# Patient Record
Sex: Female | Born: 1975 | Race: Black or African American | Hispanic: No | Marital: Single | Smoking: Never smoker
Health system: Southern US, Community
[De-identification: ages and names within clinical notes are randomized; demographics above are authoritative.]

---

## 2011-12-21 HISTORY — PX: OVARIAN CYST REMOVAL: SHX89

## 2015-04-21 ENCOUNTER — Telehealth: Payer: Self-pay | Admitting: Family Medicine

## 2015-04-21 NOTE — Telephone Encounter (Signed)
Ok per [employer].

## 2015-04-21 NOTE — Telephone Encounter (Signed)
Angela Zuniga, Angela Zuniga MRN# [id number] referred daughter Angela Zuniga to establish care with Dr. Birdie Riddle please advise.

## 2015-04-21 NOTE — Telephone Encounter (Signed)
Pt scheduled new patient appointment for 09/01/2015

## 2015-04-24 ENCOUNTER — Ambulatory Visit (INDEPENDENT_AMBULATORY_CARE_PROVIDER_SITE_OTHER): Payer: BLUE CROSS/BLUE SHIELD | Admitting: Medical

## 2015-04-24 ENCOUNTER — Encounter: Payer: Self-pay | Admitting: Medical

## 2015-04-24 VITALS — BP 135/85 | HR 86 | Temp 98.8°F | Ht 62.5 in | Wt 217.8 lb

## 2015-04-24 DIAGNOSIS — G43809 Other migraine, not intractable, without status migrainosus: Secondary | ICD-10-CM

## 2015-04-24 DIAGNOSIS — R030 Elevated blood-pressure reading, without diagnosis of hypertension: Secondary | ICD-10-CM | POA: Diagnosis not present

## 2015-04-24 DIAGNOSIS — IMO0001 Reserved for inherently not codable concepts without codable children: Secondary | ICD-10-CM

## 2015-04-24 DIAGNOSIS — D179 Benign lipomatous neoplasm, unspecified: Secondary | ICD-10-CM

## 2015-04-24 MED ORDER — SUMATRIPTAN SUCCINATE 50 MG PO TABS: 50.0000 mg | ORAL_TABLET | ORAL | Status: AC | PRN

## 2015-04-24 NOTE — Progress Notes (Signed)
Pre visit review using our clinic review tool, if applicable. No additional management support is needed unless otherwise documented below in the visit note. 

## 2015-04-24 NOTE — Assessment & Plan Note (Signed)
Will rx imitrex. Since ha intensity worsened past month. Long hx of HA and no prior imaging studies will try to get outpt nonemergent ct of head. If not cleared by insurance then refer to neurologist. If at any point severe ha or neuro signs or symptoms as advised then ED eval.

## 2015-04-24 NOTE — Patient Instructions (Signed)
Migraine variant Will rx imitrex. Since ha intensity worsened past month. Long hx of HA and no prior imaging studies will try to get outpt nonemergent ct of head. If not cleared by insurance then refer to neurologist. If at any point severe ha or neuro signs or symptoms as advised then ED eval.   Elevated BP Mild bp elevation today by lpn then when I checked less. Low salt diet. Check bp when can and document reading. Elevated bp could be contributing factor for ha.   Lipoma Left side of scalp. Refer to general surgeon.    Follow up in 2 wks or as needed.

## 2015-04-24 NOTE — Assessment & Plan Note (Signed)
Mild bp elevation today by lpn then when I checked less. Low salt diet. Check bp when can and document reading. Elevated bp could be contributing factor for ha.

## 2015-04-24 NOTE — Assessment & Plan Note (Signed)
Left side of scalp. Refer to general surgeon.

## 2015-04-24 NOTE — Progress Notes (Signed)
   Subjective:    Patient ID: Angela Zuniga, female    DOB: [birth year], 39 y.o.   MRN: [id number]  HPI  I have reviewed pt PMH, PSH, FH, Social History and Surgical History  No pmh reported. 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  Pt in states she has history of faint/slight ha. Hx of these since Highschool. In past pt mentions ha on average 3 days a week. Would use excedrin migraine. Would take ha away witin 1 hour or 2. Then last month she reports daily. Exedrin migraine will ease up ha but won't take it away. Before past month would get level 7/10 at most. Past month ha closer to 10. No vision problems. Pain/ha  has always been left eye frontal/periorbital. Pt describes light sensitivity. Ha at time sound sensitive. Pt states gong to sleep will ease up ha. Pt has low level ha now level 2. But last severe ha last week.   Pt did go to ha clinic last year HA clinic in [place]. Conservative measures discussed. Followed diet measures and did help some. Pt never followed up with that MD. No imaging studies done.  Small lump on left side of scalp. Over past month this has grown.   LMP- Apr 21, 2015.   Review of Systems  Constitutional: Negative for fever, chills, diaphoresis, activity change and fatigue.  Respiratory: Negative for cough, chest tightness and shortness of breath.   Cardiovascular: Negative for chest pain, palpitations and leg swelling.  Gastrointestinal: Negative for nausea, vomiting and abdominal pain.  Musculoskeletal: Negative for neck pain and neck stiffness.  Skin:       Lump lt scalp area.  Neurological: Positive for headaches. Negative for dizziness, tremors, seizures, syncope, facial asymmetry, speech difficulty, weakness, light-headedness and numbness.       1-2 level now.  Psychiatric/Behavioral: Negative for behavioral problems, confusion and  agitation. The patient is not nervous/anxious.        Objective:   Physical Exam  General Mental Status- Alert. General Appearance- Not in acute distress.   Skin General: Color- Normal Color. Moisture- Normal Moisture.  Neck Carotid Arteries- Normal color. Moisture- Normal Moisture. No carotid bruits. No JVD.  Chest and Lung Exam Auscultation: Breath Sounds:-Normal.  Cardiovascular Auscultation:Rythm- Regular. Murmurs & Other Heart Sounds:Auscultation of the heart reveals- No Murmurs.  Abdomen Inspection:-Inspeection Normal. Palpation/Percussion:Note:No mass. Palpation and Percussion of the abdomen reveal- Non Tender, Non Distended + BS, no rebound or guarding.    Neurologic Cranial Nerve exam:- CN III-XII intact(No nystagmus), symmetric smile. Drift Test:- No drift. Romberg Exam:- Negative.  Heal to Toe Gait exam:-Normal. Finger to Nose:- Normal/Intact Strength:- 5/5 equal and symmetric strength both upper and lower extremities.  Skin- just behind lt  temporal area has moderate sized likely lipoma about 1.5 cm in size.      Assessment & Plan:

## 2015-04-30 ENCOUNTER — Ambulatory Visit (HOSPITAL_BASED_OUTPATIENT_CLINIC_OR_DEPARTMENT_OTHER)
Admission: RE | Admit: 2015-04-30 | Discharge: 2015-04-30 | Disposition: A | Discharge status: Home / Self Care | Payer: BLUE CROSS/BLUE SHIELD | Source: Ambulatory Visit | Attending: Medical | Admitting: Medical

## 2015-04-30 DIAGNOSIS — G43809 Other migraine, not intractable, without status migrainosus: Secondary | ICD-10-CM | POA: Insufficient documentation

## 2015-05-08 ENCOUNTER — Ambulatory Visit (INDEPENDENT_AMBULATORY_CARE_PROVIDER_SITE_OTHER): Payer: BLUE CROSS/BLUE SHIELD | Admitting: Medical

## 2015-05-08 ENCOUNTER — Encounter: Payer: Self-pay | Admitting: Medical

## 2015-05-08 VITALS — BP 128/84 | HR 73 | Temp 98.9°F | Ht 62.5 in | Wt 213.6 lb

## 2015-05-08 DIAGNOSIS — D179 Benign lipomatous neoplasm, unspecified: Secondary | ICD-10-CM

## 2015-05-08 DIAGNOSIS — IMO0001 Reserved for inherently not codable concepts without codable children: Secondary | ICD-10-CM

## 2015-05-08 DIAGNOSIS — E669 Obesity, unspecified: Secondary | ICD-10-CM

## 2015-05-08 DIAGNOSIS — R030 Elevated blood-pressure reading, without diagnosis of hypertension: Secondary | ICD-10-CM

## 2015-05-08 DIAGNOSIS — G43809 Other migraine, not intractable, without status migrainosus: Secondary | ICD-10-CM | POA: Diagnosis not present

## 2015-05-08 NOTE — Assessment & Plan Note (Signed)
Follow up with surgeon on 24th of may.

## 2015-05-08 NOTE — Progress Notes (Signed)
Subjective:    Patient ID: Angela Zuniga, female    DOB: [birth year], 39 y.o.   MRN: [id number]  HPI  Pt bp is good today and since last visit she checked it one time. She does not remember the reading.   Pt states ha are better. She had 2 ha since last visit. 1st ha that she had she used imitrex. It helped with ha and ha never got to high level pain. Nor did it get light or sound sensitive. CT of head was negative. 2nd ha she just took a nap and resolved by itself.  Pt has not seen the surgeon yet regarding lipoma left side of scalp. She will see surgeon on May 24th.  Pt last physical maybe 5 years.But pap smear within last year. She has gyn.    Review of Systems  Constitutional: Negative for fever, chills, diaphoresis, activity change and fatigue.  Respiratory: Negative for cough, chest tightness and shortness of breath.   Cardiovascular: Negative for chest pain, palpitations and leg swelling.  Gastrointestinal: Negative for nausea, vomiting and abdominal pain.  Musculoskeletal: Negative for neck pain and neck stiffness.  Neurological: Positive for headaches. Negative for dizziness, tremors, seizures, syncope, facial asymmetry, speech difficulty, weakness, light-headedness and numbness.       Better. Less frequent and less intense.  Psychiatric/Behavioral: Negative for behavioral problems, confusion and agitation. The patient is not nervous/anxious.     No past medical history on file.  History   Social History  . Marital Status: Single    Spouse Name: N/A  . Number of Children: N/A  . Years of Education: N/A   Occupational History  . Not on file.   Social History Main Topics  . Smoking status: Never Smoker   . Smokeless tobacco: Never Used  . Alcohol Use: 0.0 oz/week    0 Standard drinks or equivalent per week     Comment: 1 beer a week or glass of wine.  . Drug Use: No  . Sexual Activity: Yes   Other Topics Concern  . Not on file   Social History Narrative      Past Surgical History  Procedure Laterality Date  . Ovarian cyst removal  2013    Family History  Problem Relation Age of Onset  . Migraines Mother     No Known Allergies  Current Outpatient Prescriptions on File Prior to Visit  Medication Sig Dispense Refill  . SUMAtriptan (IMITREX) 50 MG tablet Take 1 tablet (50 mg total) by mouth every 2 (two) hours as needed for migraine. May repeat in 2 hours if headache persists or recurs. 10 tablet 0   No current facility-administered medications on file prior to visit.    BP 128/84 mmHg  Pulse 73  Temp(Src) 98.9 F (37.2 C) (Oral)  Ht 5' 2.5" (1.588 m)  Wt 213 lb 9.6 oz (96.888 kg)  BMI 38.42 kg/m2  SpO2 98%  LMP 04/21/2015       Objective:   Physical Exam  General Mental Status- Alert. General Appearance- Not in acute distress.   Skin General: Color- Normal Color. Moisture- Normal Moisture.  Neck Carotid Arteries- Normal color. Moisture- Normal Moisture. No carotid bruits. No JVD.  Chest and Lung Exam Auscultation: Breath Sounds:-Normal. cta.  Cardiovascular Auscultation:Rythm- Regular, rate and rhythm. Murmurs & Other Heart Sounds:Auscultation of the heart reveals- No Murmurs.   Neurologic Cranial Nerve exam:- CN III-XII intact(No nystagmus), symmetric smile. Strength:- 5/5 equal and symmetric strength both upper and lower extremities.  Assessment & Plan:

## 2015-05-08 NOTE — Progress Notes (Signed)
Pre visit review using our clinic review tool, if applicable. No additional management support is needed unless otherwise documented below in the visit note. 

## 2015-05-08 NOTE — Patient Instructions (Addendum)
Migraine variant Ha doing well. Much improved overall and did respond to imetrex. Ct was negative. In event severe ha with neurologic signs or symptoms as advised then ED.   Elevated BP Good level today. If you do get a chance still check periodically when she can. Would want to see below 140/90.   Lipoma Follow up with surgeon on 24th of may.   Obesity Pt has been diet, exercise. Reduce dcarbohydrates, reduced frozen foods since march. About 5 pounds weight loss.   Pt has never done weight watchers. Recommended to investigate this.   Then please schedule wellness. Will check tsh at that time.     Pt has remaining imitrex available if she needs.  At your convenience over next 3-4 months could call and schedule fasting wellness exam.

## 2015-05-08 NOTE — Assessment & Plan Note (Signed)
Angela Zuniga doing well. Much improved overall and did respond to imetrex. Ct was negative. In event severe ha with neurologic signs or symptoms as advised then ED.

## 2015-05-08 NOTE — Assessment & Plan Note (Signed)
Good level today. If you do get a chance still check periodically when she can. Would want to see below 140/90.

## 2015-05-08 NOTE — Assessment & Plan Note (Signed)
Pt has been diet, exercise. Reduce dcarbohydrates, reduced frozen foods since march. About 5 pounds weight loss.   Pt has never done weight watchers. Recommended to investigate this.   Then please schedule wellness. Will check tsh at that time.

## 2015-05-13 ENCOUNTER — Ambulatory Visit: Payer: Self-pay | Admitting: Surgery

## 2015-05-13 NOTE — H&P (Signed)
History of Present Illness Angela Zuniga. Angela Lindo MD; 05/13/2015 5:04 PM) Patient words: scalp lipoma.  The patient is a 39 year old female who presents with a complaint of Mass. Referred by [name], PA-C for evaluation of left scalp mass. This is a 39 yo female who presents with an enlarging mass on left side of scalp - 2.5 cm. this has never become inflamed or infected. It has caused some tenderness. The patient suffers from frequent migraine headaches. It is unclear whether this mass contributes to her headaches. However as it continues to grow and protrudes she would like to have it excised. Other Problems Angela Zuniga, CMA; 05/13/2015 2:40 PM) Migraine Headache  Diagnostic Studies History Angela Zuniga, CMA; 05/13/2015 2:40 PM) Colonoscopy never Mammogram never Pap Smear 1-5 years ago  Allergies Angela Zuniga, CMA; 05/13/2015 2:41 PM) No Known Drug Allergies 05/13/2015  Medication History Angela Zuniga, CMA; 05/13/2015 2:41 PM) SUMAtriptan Succinate (50MG  Tablet, Oral) Active. Medications Reconciled  Social History Angela Zuniga, [place]; 05/13/2015 2:40 PM) Alcohol use Occasional alcohol use. Caffeine use Carbonated beverages, Coffee, Tea. No drug use Tobacco use Never smoker.  Family History Angela Zuniga, [place]; 05/13/2015 2:40 PM) Alcohol Abuse Family Members In General. Arthritis Mother. Bleeding disorder Mother. Diabetes Mellitus Family Members In General. Hypertension Family Members In General. Ischemic Bowel Disease Mother. Migraine Headache Mother.  Pregnancy / Birth History Angela Zuniga, CMA; 05/13/2015 2:40 PM) Angela Zuniga Age 1 Para 0 Regular periods     Review of Systems Angela Zuniga CMA; 05/13/2015 2:40 PM) General Not Present- Appetite Loss, Chills, Fatigue, Fever, Night Sweats, Weight Gain and Weight Loss. Skin Not Present- Change in Wart/Mole, Dryness, Hives, Jaundice, New Lesions, Non-Healing Wounds, Rash and Ulcer. HEENT Present- Wears  glasses/contact lenses. Not Present- Earache, Hearing Loss, Hoarseness, Nose Bleed, Oral Ulcers, Ringing in the Ears, Seasonal Allergies, Sinus Pain, Sore Throat, Visual Disturbances and Yellow Eyes. Respiratory Present- Snoring. Not Present- Bloody sputum, Chronic Cough, Difficulty Breathing and Wheezing. Cardiovascular Present- Swelling of Extremities. Not Present- Chest Pain, Difficulty Breathing Lying Down, Leg Cramps, Palpitations, Rapid Heart Rate and Shortness of Breath. Gastrointestinal Not Present- Abdominal Pain, Bloating, Bloody Stool, Change in Bowel Habits, Chronic diarrhea, Constipation, Difficulty Swallowing, Excessive gas, Gets full quickly at meals, Hemorrhoids, Indigestion, Nausea, Rectal Pain and Vomiting. Female Genitourinary Not Present- Frequency, Nocturia, Painful Urination, Pelvic Pain and Urgency. Musculoskeletal Present- Back Pain. Not Present- Joint Pain, Joint Stiffness, Muscle Pain, Muscle Weakness and Swelling of Extremities. Neurological Present- Headaches. Not Present- Decreased Memory, Fainting, Numbness, Seizures, Tingling, Tremor, Trouble walking and Weakness. Psychiatric Not Present- Anxiety, Bipolar, Change in Sleep Pattern, Depression, Fearful and Frequent crying. Endocrine Not Present- Cold Intolerance, Excessive Hunger, Hair Changes, Heat Intolerance, Hot flashes and New Diabetes. Hematology Not Present- Easy Bruising, Excessive bleeding, Gland problems, HIV and Persistent Infections.  Vitals Angela Zuniga CMA; 05/13/2015 2:41 PM) 05/13/2015 2:41 PM Weight: 213 lb Height: 62in Body Surface Area: 2.06 m Body Mass Index: 38.96 kg/m Temp.: 99.24F(Oral)  Pulse: 89 (Regular)  Resp.: 18 (Unlabored)  BP: 130/70 (Sitting, Left Arm, Standard)     Physical Exam Rodman Key K. Desten Manor MD; 05/13/2015 5:04 PM)  The physical exam findings are as follows: Note:WDWN in NAD Left scalp under hair - 2.5 cm protruding subcutaneous mass; firm, well-demarcated. No  sign of infection    Assessment & Plan Angela Zuniga. Angela Loftin MD; 05/13/2015 5:05 PM)  SCALP MASS (782.2  R22.0)  Current Plans Schedule for Surgery - Excision of mass - left scalp - 2.5 cm. This could represent  lipoma vs sebaceous cyst. The surgical procedure has been discussed with the patient. Potential risks, benefits, alternative treatments, and expected outcomes have been explained. All of the patient's questions at this time have been answered. The likelihood of reaching the patient's treatment goal is good. The patient understand the proposed surgical procedure and wishes to proceed.   Angela Zuniga. [name], MD, [facility]  General/ Trauma Surgery  05/13/2015 5:06 PM

## 2015-06-03 ENCOUNTER — Telehealth: Payer: Self-pay

## 2015-06-03 NOTE — Telephone Encounter (Signed)
LMOVM

## 2015-06-05 ENCOUNTER — Encounter: Payer: BLUE CROSS/BLUE SHIELD | Admitting: Medical

## 2015-08-29 ENCOUNTER — Telehealth: Payer: Self-pay | Admitting: *Deleted

## 2015-08-29 NOTE — Telephone Encounter (Signed)
Unable to reach patient at time of Pre-Visit Call.  Left message for patient to return call when available.    

## 2015-09-01 ENCOUNTER — Telehealth: Payer: Self-pay | Admitting: Family Medicine

## 2015-09-01 ENCOUNTER — Ambulatory Visit: Payer: BLUE CROSS/BLUE SHIELD | Admitting: Family Medicine

## 2015-09-17 NOTE — Telephone Encounter (Signed)
Pt was no show 09/01/15 1:30pm for new pt appt, pt has not rescheduled, charge no show fee?

## 2015-09-17 NOTE — Telephone Encounter (Signed)
Yes- pt needs no show charge

## 2016-09-30 IMAGING — CT CT HEAD W/O CM
1 series · 16 of 30 positions shown, 20 images · non-contrast
Comparison: None.

CLINICAL DATA: Migraine variant. Chronic headaches for 20 years,
worsened in the past month. Left greater than right-sided head pain.

EXAM:
CT HEAD WITHOUT CONTRAST
TECHNIQUE: Contiguous axial images were obtained from the base of the skull
through the vertex without intravenous contrast.

[Series 2: head 4.8 h37s · axial · 0.45mm/px · z∈[+1205,+1341]mm · 16 of 32 slices shown, 20 images]
[im 2/32  brain]
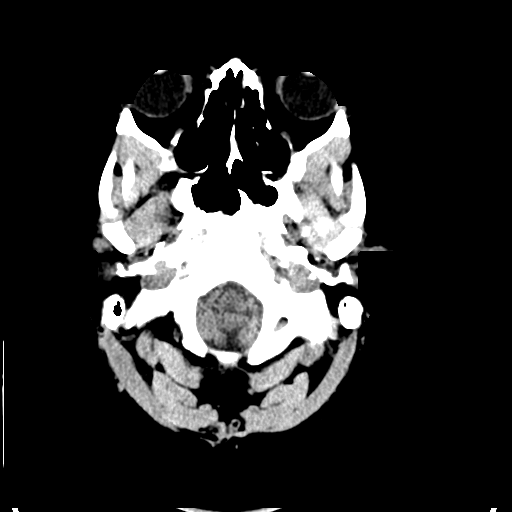
[im 2/32  bone]
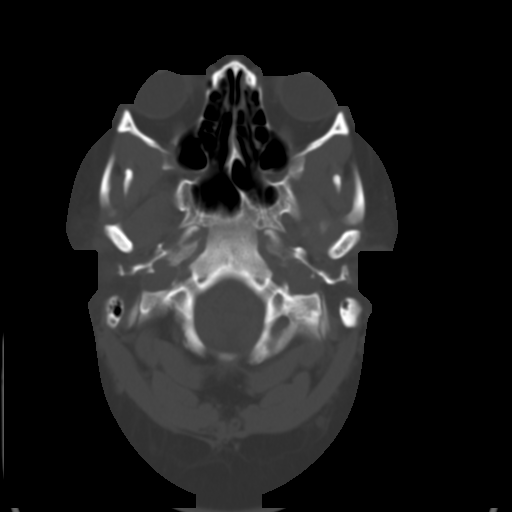
[im 4/32  brain]
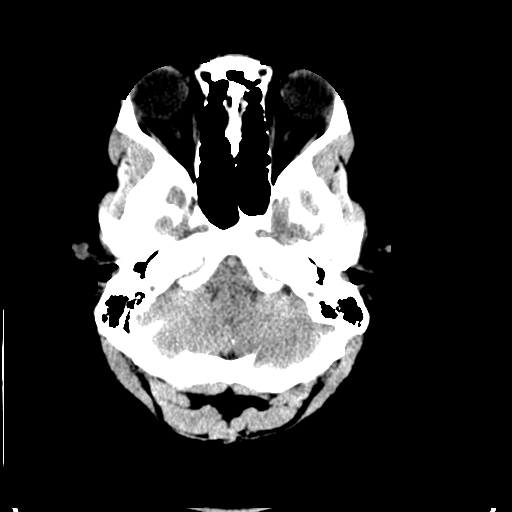
[im 6/32  brain]
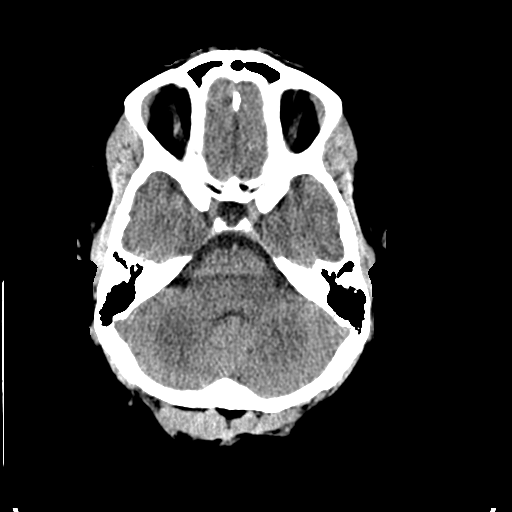
[im 8/32  brain]
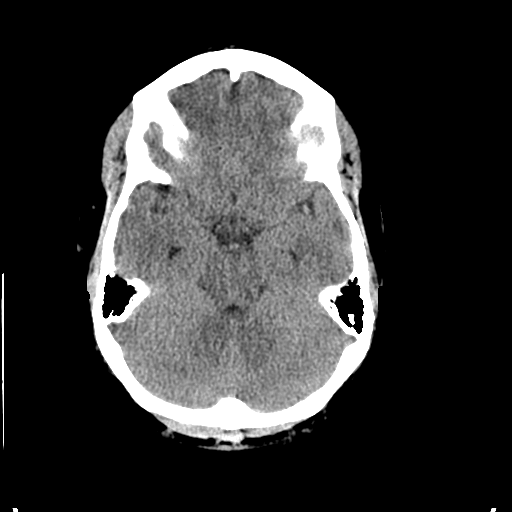
[im 9/32  brain]
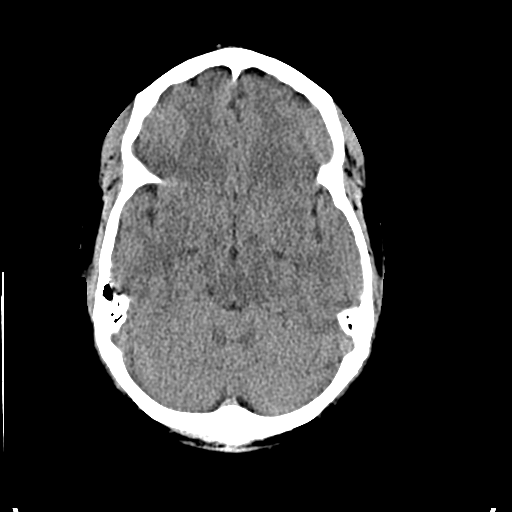
[im 9/32  bone]
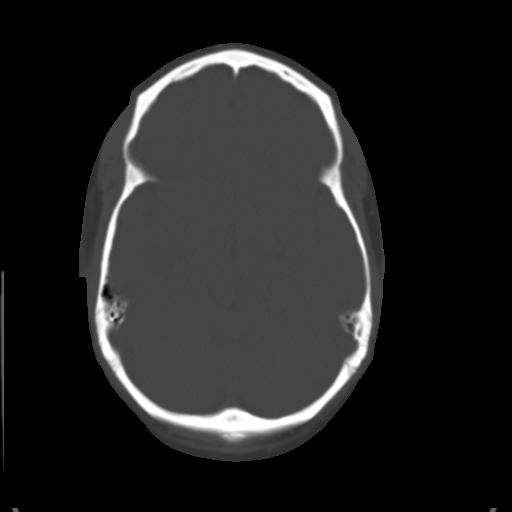
[im 11/32  brain]
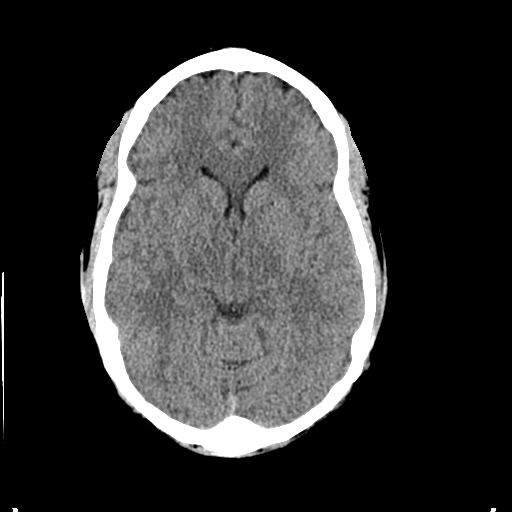
[im 13/32  brain]
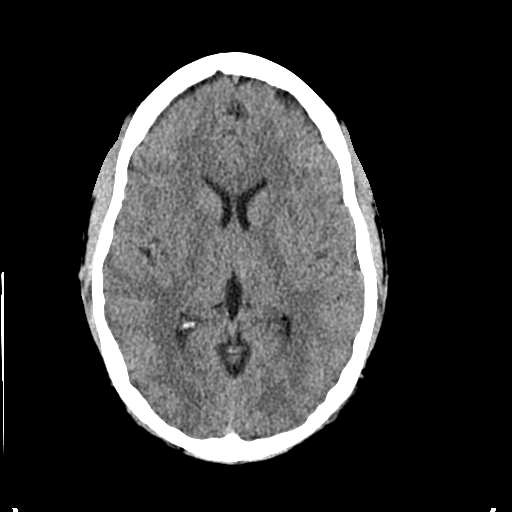
[im 15/32  brain]
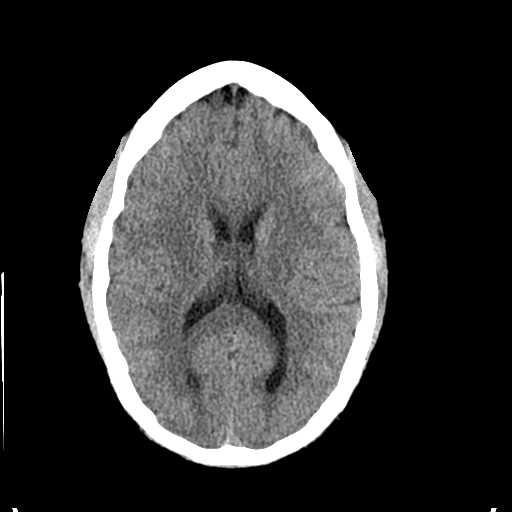
[im 17/32  brain]
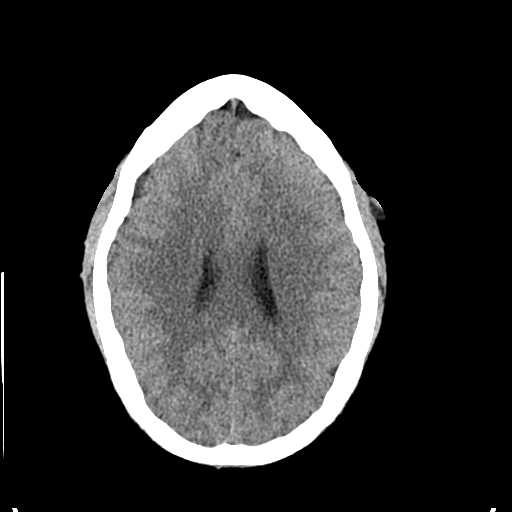
[im 17/32  bone]
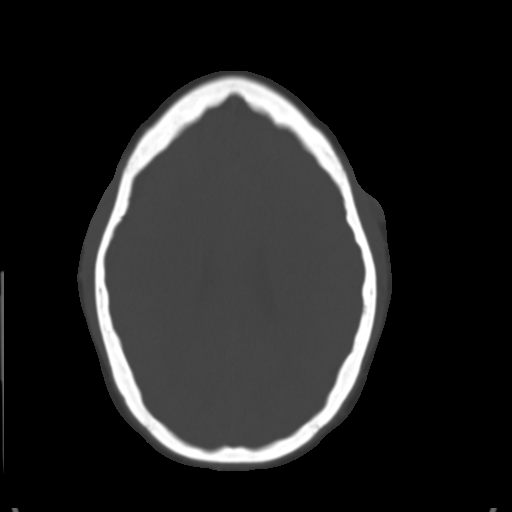
[im 19/32  brain]
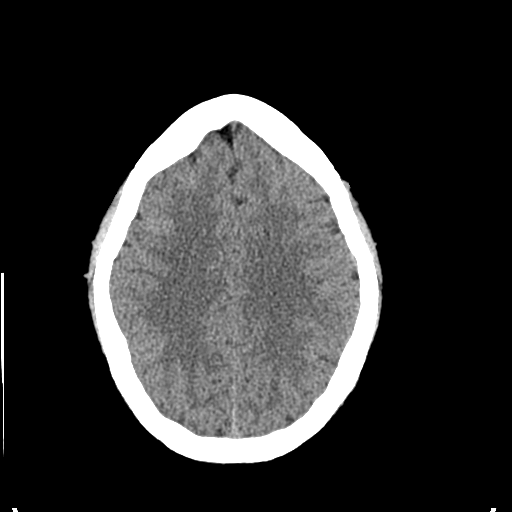
[im 21/32  brain]
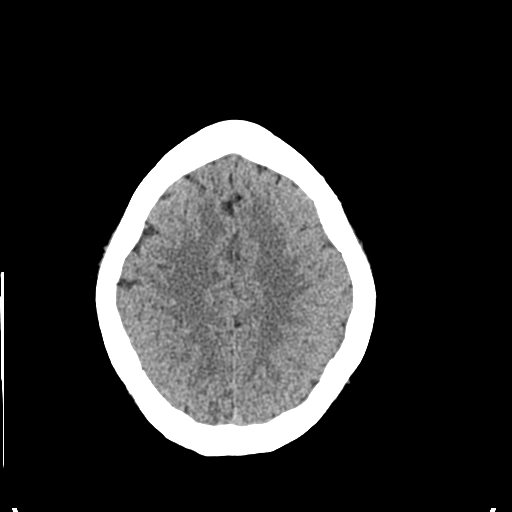
[im 23/32  brain]
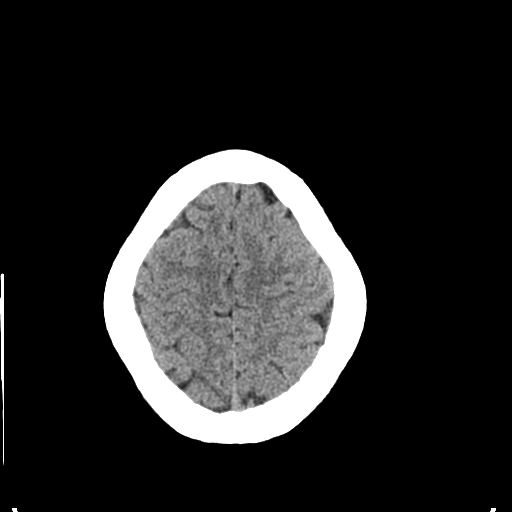
[im 24/32  brain]
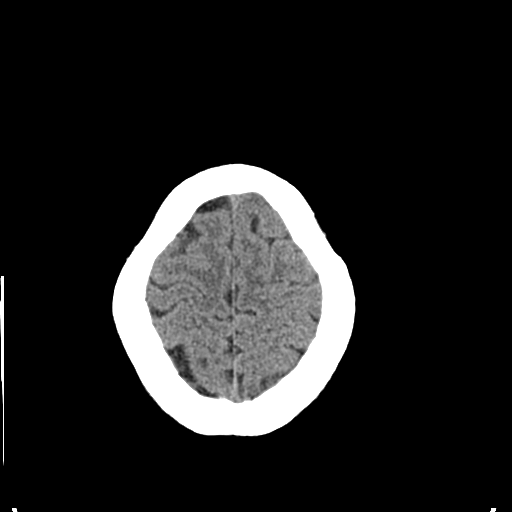
[im 24/32  bone]
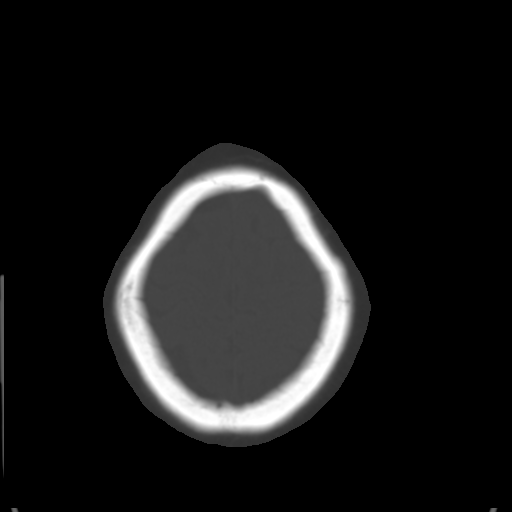
[im 26/32  brain]
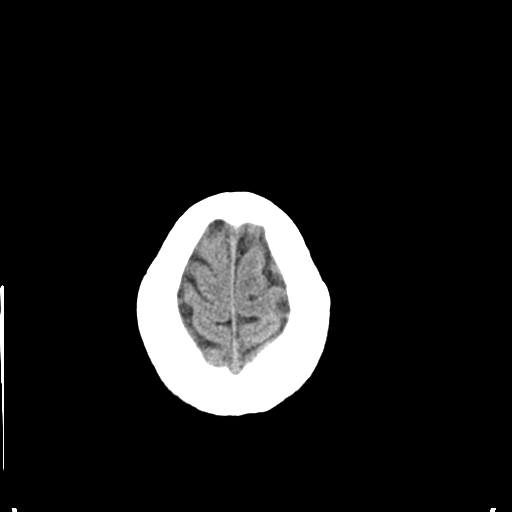
[im 28/32  brain]
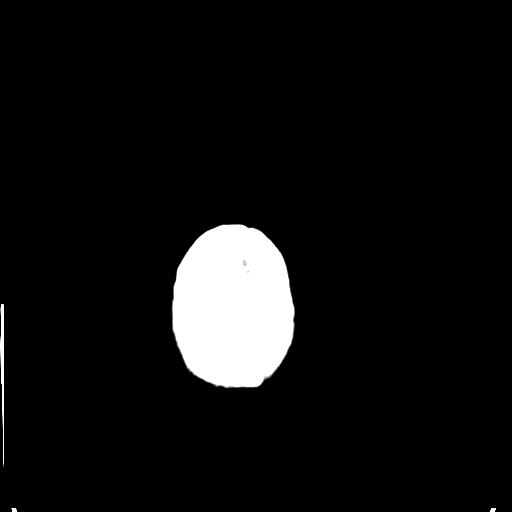
[im 30/32  brain]
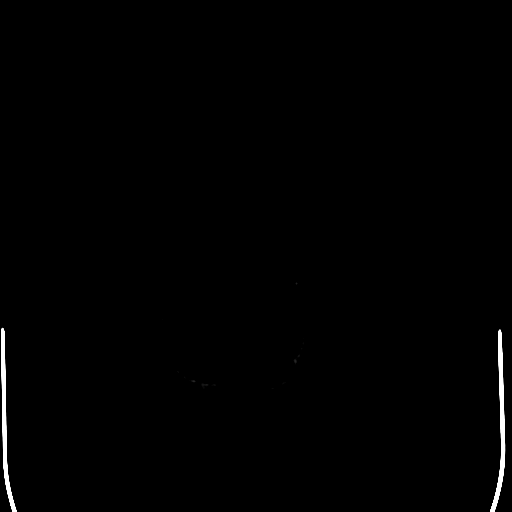

[16 of 30 positions shown; findings below may reference images not displayed]

FINDINGS: There is no evidence of acute cortical infarct, intracranial
hemorrhage, mass, midline shift, or extra-axial fluid collection.
Ventricles and sulci are normal for age. Gray-white differentiation
is preserved.

Visualized orbits are unremarkable. There is a 1.6 cm nonspecific
partially calcified lesion in the left frontal scalp. Visualized
paranasal sinuses and mastoid air cells are clear. No skull lesions.
IMPRESSION: Unremarkable CT appearance of the brain.

## 2018-09-19 ENCOUNTER — Encounter (HOSPITAL_BASED_OUTPATIENT_CLINIC_OR_DEPARTMENT_OTHER): Payer: Self-pay

## 2018-09-19 ENCOUNTER — Other Ambulatory Visit: Payer: Self-pay

## 2018-09-19 ENCOUNTER — Emergency Department (HOSPITAL_BASED_OUTPATIENT_CLINIC_OR_DEPARTMENT_OTHER): Payer: 59

## 2018-09-19 ENCOUNTER — Emergency Department (HOSPITAL_BASED_OUTPATIENT_CLINIC_OR_DEPARTMENT_OTHER)
Admission: EM | Admit: 2018-09-19 | Discharge: 2018-09-19 | Disposition: A | Discharge status: Home / Self Care | Payer: 59 | Attending: Emergency Medicine | Admitting: Emergency Medicine

## 2018-09-19 DIAGNOSIS — M25512 Pain in left shoulder: Secondary | ICD-10-CM | POA: Insufficient documentation

## 2018-09-19 DIAGNOSIS — M7918 Myalgia, other site: Secondary | ICD-10-CM

## 2018-09-19 DIAGNOSIS — R0789 Other chest pain: Secondary | ICD-10-CM | POA: Diagnosis not present

## 2018-09-19 DIAGNOSIS — M542 Cervicalgia: Secondary | ICD-10-CM | POA: Diagnosis present
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
## 2018-09-19 NOTE — Discharge Instructions (Addendum)
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
## 2018-09-19 NOTE — ED Notes (Signed)
Returned from xray
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
